# Patient Record
Sex: Male | Born: 1995 | Race: Black or African American | Hispanic: No | Marital: Single | Smoking: Current every day smoker
Health system: Southern US, Community
[De-identification: ages and names within clinical notes are randomized; demographics above are authoritative.]

---
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
## 2014-12-31 ENCOUNTER — Encounter (HOSPITAL_COMMUNITY): Payer: Self-pay | Admitting: *Deleted

## 2014-12-31 ENCOUNTER — Emergency Department (HOSPITAL_COMMUNITY)
Admission: EM | Admit: 2014-12-31 | Discharge: 2014-12-31 | Disposition: A | Discharge status: Home / Self Care | Payer: Self-pay | Attending: Emergency Medicine | Admitting: Emergency Medicine

## 2014-12-31 DIAGNOSIS — Y92410 Unspecified street and highway as the place of occurrence of the external cause: Secondary | ICD-10-CM | POA: Insufficient documentation

## 2014-12-31 DIAGNOSIS — S8991XA Unspecified injury of right lower leg, initial encounter: Secondary | ICD-10-CM | POA: Insufficient documentation

## 2014-12-31 DIAGNOSIS — Y998 Other external cause status: Secondary | ICD-10-CM | POA: Insufficient documentation

## 2014-12-31 DIAGNOSIS — Z720 Tobacco use: Secondary | ICD-10-CM | POA: Insufficient documentation

## 2014-12-31 DIAGNOSIS — Y9389 Activity, other specified: Secondary | ICD-10-CM | POA: Insufficient documentation

## 2014-12-31 MED ORDER — ACETAMINOPHEN 325 MG PO TABS
650.0000 mg | ORAL_TABLET | Freq: Once | ORAL | Status: DC
  Filled 2014-12-31: qty 2

## 2014-12-31 MED ORDER — IBUPROFEN 400 MG PO TABS
800.0000 mg | ORAL_TABLET | Freq: Once | ORAL | Status: AC
  Administered 2014-12-31: 800 mg via ORAL
  Filled 2014-12-31: qty 2

## 2014-12-31 MED ORDER — IBUPROFEN 800 MG PO TABS: 800.0000 mg | ORAL_TABLET | Freq: Three times a day (TID) | ORAL | Status: AC

## 2014-12-31 MED ORDER — METHOCARBAMOL 500 MG PO TABS: 500.0000 mg | ORAL_TABLET | Freq: Two times a day (BID) | ORAL | Status: AC

## 2014-12-31 NOTE — Discharge Instructions (Signed)

## 2014-12-31 NOTE — ED Notes (Signed)
Declined W/C at D/C and was escorted to lobby by RN. 
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
